# Patient Record
Sex: Male | Born: 1990 | Race: Black or African American | Hispanic: No | Marital: Single | Smoking: Never smoker
Health system: Southern US, Community
[De-identification: ages and names within clinical notes are randomized; demographics above are authoritative.]

## PROBLEM LIST (undated history)

## (undated) DIAGNOSIS — Z789 Other specified health status: Secondary | ICD-10-CM

---
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
## 2012-12-19 ENCOUNTER — Encounter (HOSPITAL_COMMUNITY): Payer: Self-pay | Admitting: Emergency Medicine

## 2012-12-19 ENCOUNTER — Emergency Department (HOSPITAL_COMMUNITY)
Admission: EM | Admit: 2012-12-19 | Discharge: 2012-12-19 | Disposition: A | Discharge status: Home / Self Care | Payer: BC Managed Care – PPO | Attending: Emergency Medicine | Admitting: Emergency Medicine

## 2012-12-19 ENCOUNTER — Emergency Department (HOSPITAL_COMMUNITY): Payer: BC Managed Care – PPO

## 2012-12-19 DIAGNOSIS — M25569 Pain in unspecified knee: Secondary | ICD-10-CM | POA: Insufficient documentation

## 2012-12-19 DIAGNOSIS — M25562 Pain in left knee: Secondary | ICD-10-CM

## 2012-12-19 DIAGNOSIS — R52 Pain, unspecified: Secondary | ICD-10-CM | POA: Insufficient documentation

## 2012-12-19 MED ORDER — IBUPROFEN 800 MG PO TABS
800.0000 mg | ORAL_TABLET | Freq: Once | ORAL | Status: AC
  Administered 2012-12-19: 800 mg via ORAL
  Filled 2012-12-19: qty 1

## 2012-12-19 MED ORDER — IBUPROFEN 600 MG PO TABS: 600.0000 mg | ORAL_TABLET | Freq: Four times a day (QID) | ORAL | Status: AC | PRN

## 2012-12-19 NOTE — ED Provider Notes (Signed)
CSN: [id number]     Arrival date & time 12/19/12  1120 History   First MD Initiated Contact with Patient 12/19/12 1129     Chief Complaint  Patient presents with  . Knee Injury   (Consider location/radiation/quality/duration/timing/severity/associated sxs/prior Treatment) HPI Comments: Patient is a 22 year old male with a history of bilateral knee surgery during childhood secondary to bowing of legs who presents today for L knee pain. Patient states he felt and "electric" type pain in his left knee one week ago which was fleeting and resolved spontaneously. He endorses the pain recurring 2 days ago which has persisted as an aching, sharp, throbbing, stabbing pain since this time. Pain is nonradiating and worse with palpation and ambulation/weightbearing. Patient has applied ice to the affected area with mild relief. He has not tried any over-the-counter medications. He denies any direct trauma or injury to his left knee inciting pain as well as associated fever, redness, pallor, numbness/tingling, and weakness.  The history is provided by the patient. No language interpreter was used.    History reviewed. No pertinent past medical history. History reviewed. No pertinent past surgical history. History reviewed. No pertinent family history. History  Substance Use Topics  . Smoking status: Never Smoker   . Smokeless tobacco: Never Used  . Alcohol Use: No    Review of Systems  Musculoskeletal: Positive for arthralgias and joint swelling.  All other systems reviewed and are negative.    Allergies  Review of patient's allergies indicates no known allergies.  Home Medications   Current Outpatient Rx  Name  Route  Sig  Dispense  Refill  . ibuprofen (ADVIL,MOTRIN) 600 MG tablet   Oral   Take 1 tablet (600 mg total) by mouth every 6 (six) hours as needed.   30 tablet   0    BP 123/59  Pulse 73  Temp(Src) 99.1 F (37.3 C) (Oral)  Resp 18  SpO2 100%  Physical Exam  Nursing  note and vitals reviewed. Constitutional: He is oriented to person, place, and time. He appears well-developed and well-nourished. No distress.  HENT:  Head: Normocephalic and atraumatic.  Eyes: Conjunctivae and EOM are normal. No scleral icterus.  Neck: Normal range of motion.  Cardiovascular: Normal rate, regular rhythm and intact distal pulses.   DP and PT pulses 2+ in LLE.   Pulmonary/Chest: Effort normal. No respiratory distress.  Musculoskeletal:       Left knee: He exhibits decreased range of motion (Secondary to pain) and swelling (Mild). He exhibits no effusion, no ecchymosis, no deformity, no erythema, normal alignment, no LCL laxity and no MCL laxity. Tenderness found. Lateral joint line tenderness noted.       Left upper leg: Normal.       Left lower leg: Normal.       Legs: Tenderness to palpation inferior to the patella as well as to lateral joint line. Mild swelling appreciated without erythema, heat to touch, or red linear streaking. Decreased range of motion with flexion noted secondary to pain. No laxity appreciated.  Neurological: He is alert and oriented to person, place, and time.  No gross sensory deficits appreciated. Achilles and patellar reflexes and left lower extremity 2+. Patient ambulatory with antalgic gait.  Skin: Skin is warm and dry. No rash noted. He is not diaphoretic. No erythema. No pallor.  Psychiatric: He has a normal mood and affect. His behavior is normal.    ED Course  Procedures (including critical care time) Labs Review Labs Reviewed - No  data to display  Imaging Review Dg Knee Complete 4 Views Left  12/19/2012   CLINICAL DATA:  Twisting injury left knee, diffuse pain.  EXAM: LEFT KNEE - COMPLETE 4+ VIEW  COMPARISON:  None.  FINDINGS: There is a joint effusion. Slight flattening and indentation along the medial femoral condyle. Mild peaking of the tibial spines. Tripartite patella. Minimal patellofemoral osteophytosis.  IMPRESSION: 1. Small  joint effusion. 2. Slight flattening and indentation along the medial femoral condyle. An osteochondral defect cannot be excluded.   Electronically Signed   By: Leanna Battles M.D.   On: 12/19/2012 12:12    EKG Interpretation   None       MDM   1. Knee pain, acute, left    Uncomplicated left knee pain. Patient well and nontoxic appearing, hemodynamically stable, and afebrile. He is neurovascularly intact on physical exam as well as weight bearing and ambulatory with antalgic gait. No evidence of septic joint appreciated. 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 Xray significant for small joint effusion. No fracture or dislocation identified. Patient given knee sleeve in ED as well as crutches for WBAT. He will be given orthopedic referral for f/u. RICE and ibuprofen advised for symptoms. Return precautions discussed and patient agreeable to plan with no unaddressed concerns.   Filed Vitals:   12/19/12 1134  BP: 123/59  Pulse: 73  Temp: 99.1 F (37.3 C)  TempSrc: Oral  Resp: 18  SpO2: 100%       Antony Madura, PA-C 12/19/12 1237

## 2012-12-19 NOTE — ED Notes (Signed)
Transported to xray 

## 2012-12-19 NOTE — Progress Notes (Signed)
[facility] provided pt with a list of primary care resources, [facility] [employer] Card application, and [facility] information.  °

## 2012-12-19 NOTE — ED Provider Notes (Signed)
Medical screening examination/treatment/procedure(s) were performed by non-physician practitioner and as supervising physician I was immediately available for consultation/collaboration.  EKG Interpretation   None       Laylani Pudwill, MD, FACEP   Kaelen Brennan L Troy Kanouse, MD 12/19/12 1600 

## 2012-12-19 NOTE — ED Notes (Signed)
Pt was at work one week ago and felt his knee tweak. Pt states it felt like electricity in his left knee. Then it happened again 2 days ago. Pt states it feels like it is locked up like he is unable to bed his knee. Pt states knee is swollen and was purple . Pt iced knee. Pain 10/10. Pt states he had knee surgery as an infant on that knee and may have something to do with his ligaments.

## 2014-09-15 ENCOUNTER — Ambulatory Visit
Admission: EM | Admit: 2014-09-15 | Discharge: 2014-09-15 | Disposition: A | Discharge status: Hospice | Payer: Self-pay | Attending: Family Medicine | Admitting: Family Medicine

## 2014-09-15 ENCOUNTER — Emergency Department (HOSPITAL_COMMUNITY)
Admission: EM | Admit: 2014-09-15 | Discharge: 2014-09-15 | Disposition: A | Discharge status: Home / Self Care | Payer: Self-pay | Attending: Emergency Medicine | Admitting: Emergency Medicine

## 2014-09-15 ENCOUNTER — Encounter (HOSPITAL_COMMUNITY): Payer: Self-pay | Admitting: Emergency Medicine

## 2014-09-15 ENCOUNTER — Emergency Department (HOSPITAL_COMMUNITY): Payer: Self-pay

## 2014-09-15 DIAGNOSIS — R748 Abnormal levels of other serum enzymes: Secondary | ICD-10-CM

## 2014-09-15 DIAGNOSIS — R109 Unspecified abdominal pain: Secondary | ICD-10-CM

## 2014-09-15 DIAGNOSIS — N201 Calculus of ureter: Secondary | ICD-10-CM | POA: Insufficient documentation

## 2014-09-15 DIAGNOSIS — R1011 Right upper quadrant pain: Secondary | ICD-10-CM

## 2014-09-15 DIAGNOSIS — R319 Hematuria, unspecified: Secondary | ICD-10-CM

## 2014-09-15 HISTORY — DX: Other specified health status: Z78.9

## 2014-09-15 LAB — CBC WITH DIFFERENTIAL/PLATELET
BASOS ABS: 0 10*3/uL (ref 0–0.1)
Eosinophils Absolute: 0 10*3/uL (ref 0–0.7)
HCT: 39.2 % — ABNORMAL LOW (ref 40.0–52.0)
Hemoglobin: 12.7 g/dL — ABNORMAL LOW (ref 13.0–18.0)
Lymphs Abs: 0.9 10*3/uL — ABNORMAL LOW (ref 1.0–3.6)
MCH: 27.4 pg (ref 26.0–34.0)
MCHC: 32.4 g/dL (ref 32.0–36.0)
MCV: 84.4 fL (ref 80.0–100.0)
Monocytes Absolute: 0.6 10*3/uL (ref 0.2–1.0)
Monocytes Relative: 7 %
Neutro Abs: 7.3 10*3/uL — ABNORMAL HIGH (ref 1.4–6.5)
Neutrophils Relative %: 83 %
Platelets: 106 10*3/uL — ABNORMAL LOW (ref 150–440)
RBC: 4.65 MIL/uL (ref 4.40–5.90)
RDW: 13.9 % (ref 11.5–14.5)
WBC: 8.8 10*3/uL (ref 3.8–10.6)

## 2014-09-15 LAB — URINALYSIS COMPLETE WITH MICROSCOPIC (ARMC ONLY)
BACTERIA UA: NONE SEEN — AB
Bilirubin Urine: NEGATIVE
Glucose, UA: NEGATIVE mg/dL
Ketones, ur: NEGATIVE mg/dL
Leukocytes, UA: NEGATIVE
NITRITE: NEGATIVE
PH: 6 (ref 5.0–8.0)
SPECIFIC GRAVITY, URINE: 1.025 (ref 1.005–1.030)
Squamous Epithelial / LPF: NONE SEEN — AB

## 2014-09-15 LAB — COMPREHENSIVE METABOLIC PANEL
ALBUMIN: 4.2 g/dL (ref 3.5–5.0)
ALT: 57 U/L (ref 17–63)
ANION GAP: 8 (ref 5–15)
AST: 250 U/L — ABNORMAL HIGH (ref 15–41)
Alkaline Phosphatase: 54 U/L (ref 38–126)
BUN: 17 mg/dL (ref 6–20)
CHLORIDE: 106 mmol/L (ref 101–111)
CO2: 26 mmol/L (ref 22–32)
Calcium: 9.1 mg/dL (ref 8.9–10.3)
Creatinine, Ser: 0.92 mg/dL (ref 0.61–1.24)
GFR calc non Af Amer: >60 mL/min (ref >60)
Glucose, Bld: 156 mg/dL — ABNORMAL HIGH (ref 65–99)
POTASSIUM: 3.5 mmol/L (ref 3.5–5.1)
SODIUM: 140 mmol/L (ref 135–145)
Total Bilirubin: 0.3 mg/dL (ref 0.3–1.2)
Total Protein: 7.1 g/dL (ref 6.5–8.1)

## 2014-09-15 LAB — AMYLASE: Amylase: 93 U/L (ref 28–100)

## 2014-09-15 LAB — LIPASE, BLOOD: LIPASE: 18 U/L — AB (ref 22–51)

## 2014-09-15 MED ORDER — ONDANSETRON 8 MG PO TBDP: 8.0000 mg | ORAL_TABLET | Freq: Three times a day (TID) | ORAL | Status: DC | PRN

## 2014-09-15 MED ORDER — PROMETHAZINE HCL 25 MG PO TABS: 25.0000 mg | ORAL_TABLET | Freq: Four times a day (QID) | ORAL | Status: AC | PRN

## 2014-09-15 MED ORDER — ONDANSETRON 8 MG PO TBDP
8.0000 mg | ORAL_TABLET | Freq: Once | ORAL | Status: AC
  Administered 2014-09-15: 8 mg via ORAL

## 2014-09-15 MED ORDER — KETOROLAC TROMETHAMINE 60 MG/2ML IM SOLN
60.0000 mg | Freq: Once | INTRAMUSCULAR | Status: AC
  Administered 2014-09-15: 60 mg via INTRAMUSCULAR

## 2014-09-15 MED ORDER — TAMSULOSIN HCL 0.4 MG PO CAPS: 0.4000 mg | ORAL_CAPSULE | Freq: Every day | ORAL | Status: AC

## 2014-09-15 MED ORDER — HYDROCODONE-ACETAMINOPHEN 5-325 MG PO TABS: 1.0000 | ORAL_TABLET | Freq: Three times a day (TID) | ORAL | Status: AC | PRN

## 2014-09-15 NOTE — ED Notes (Signed)
Patient left at this time with all belongings. Given instructions to follow up with urology and take stone specimen to be biopsied

## 2014-09-15 NOTE — ED Provider Notes (Signed)
CSN: [id number]     Arrival date & time 09/15/14  1824 History   First MD Initiated Contact with Patient 09/15/14 1933     Chief Complaint  Patient presents with  . Abdominal Pain     (Consider location/radiation/quality/duration/timing/severity/associated sxs/prior Treatment) The history is provided by the patient.     Pt p/w sharp right flank pain that began around 11:30am today.  Pain is 10/10 intensity.  Associated N/V.  No exacerbating factors.  Pt was seen at [facility] and dx with right flank pain, hematuria, elevated LFTs.  Suspected kidney stone, also found to have elevated AST (250), Hematuria on UA without infection.  Sent to ED for CT scan.  Pt reports he had improvement with toradol and zofran, then pain returned and he took prescribed vicodin with improvement.  Currently pain is well controlled.  Denies fevers, dysuria, urinary frequency or urgency, hematuria, bowel changes, testicular pain or swelling, CP, SOB, cough.   Past Medical History  Diagnosis Date  . Patient denies medical problems    Past Surgical History  Procedure Laterality Date  . Knee surgery  2009   No family history on file. Social History  Substance Use Topics  . Smoking status: Never Smoker   . Smokeless tobacco: Never Used  . Alcohol Use: No    Review of Systems  All other systems reviewed and are negative.     Allergies  Review of patient's allergies indicates no known allergies.  Home Medications   Prior to Admission medications   Medication Sig Start Date End Date Taking? Authorizing Provider  HYDROcodone-acetaminophen (NORCO) 5-325 MG per tablet Take 1 tablet by mouth every 8 (eight) hours as needed for moderate pain. 09/15/14   Hassan Rowan, MD  ibuprofen (ADVIL,MOTRIN) 600 MG tablet Take 1 tablet (600 mg total) by mouth every 6 (six) hours as needed. 12/19/12   Antony Madura, PA-C  ondansetron (ZOFRAN ODT) 8 MG disintegrating tablet Take 1 tablet (8 mg total) by mouth  every 8 (eight) hours as needed for nausea or vomiting. 09/15/14   Hassan Rowan, MD   BP 132/83 mmHg  Pulse 67  Temp(Src) 99.1 F (37.3 C) (Oral)  Resp 18  SpO2 100% Physical Exam  Constitutional: He appears well-developed and well-nourished. No distress.  HENT:  Head: Normocephalic and atraumatic.  Neck: Neck supple.  Cardiovascular: Normal rate and regular rhythm.   Pulmonary/Chest: Effort normal and breath sounds normal. No respiratory distress. He has no wheezes. He has no rales.  Abdominal: Soft. He exhibits no distension and no mass. There is no tenderness. There is CVA tenderness (right). There is no rebound and no guarding.  Neurological: He is alert. He exhibits normal muscle tone.  Skin: He is not diaphoretic.  Nursing note and vitals reviewed.   ED Course  Procedures (including critical care time) Labs Review Labs Reviewed - No data to display  Imaging Review Ct Abdomen Pelvis Wo Contrast  09/15/2014   CLINICAL DATA:  24 year old male with right flank pain, CVA tenderness, microhematuria.  EXAM: CT ABDOMEN AND PELVIS WITHOUT CONTRAST  TECHNIQUE: Multidetector CT imaging of the abdomen and pelvis was performed following the standard protocol without IV contrast.  COMPARISON:  None.  FINDINGS: Lung bases are clear.  Punctate sized stones in both kidneys. Mild hydronephrosis and hydroureter on the right with a 3 mm stone demonstrated at the right ureterovesical junction. Minimal stranding around the right kidney. No hydronephrosis or hydroureter on the left. No bladder wall thickening.  No bladder stones.  Unenhanced appearance of the liver, spleen, pancreas, adrenal glands, abdominal aorta, inferior vena cava, and retroperitoneal lymph nodes is unremarkable. Stomach, small bowel, and colon are not abnormally distended. No free air or free fluid in the abdomen. Abdominal wall musculature appears intact.  Pelvis: Appendix is normal. No free or loculated pelvic fluid collections.  Prostate gland is not enlarged. No pelvic mass or lymphadenopathy. No destructive bone lesions.  IMPRESSION: 3 mm stone in the distal right ureter with mild proximal obstruction. Multiple additional punctate size nonobstructing stones in both kidneys.   Electronically Signed   By: Burman Nieves M.D.   On: 09/15/2014 21:36   I have personally reviewed and evaluated these images and lab results as part of my medical decision-making.   EKG Interpretation None       10:01 PM Pt remains comfortable, is hungry.    MDM   Final diagnoses:  Right ureteral stone    Afebrile, nontoxic patient with right flank pain that began acutely this morning. Associated N/V.  Seen at [facility] urgent care with hematuria no infection on UA.  Labs significant for AST elevation.  No RUQ tenderness.  Pt feeling improvement with toradol and home meds. It does not appear the original facility performed a urine culture so I did order this prior to his discharge.  CT shows 3mm stone and distal right ureter.  D/C home with phenergan (ODT zofran previously prescribed was too expensive for him), tamsulosin, urology follow up.  Discussed result, findings, treatment, and follow up  with patient.  Pt given return precautions.  Pt verbalizes understanding and agrees with plan.       Please see labs performed earlier this afternoon at outside [facility] facility.     Trixie Dredge, PA-C 09/15/14 2252  Tilden Fossa, MD 09/16/14 [phone]

## 2014-09-15 NOTE — ED Notes (Signed)
Pt reports that he was seen at urgent care and was sent here to rule out kidney stones. Pt reports sudden onset of right flank pain radiating to his RLQ. Pt alert x4. NAD at this time.

## 2014-09-15 NOTE — ED Notes (Signed)
Right sided sharp pain. 9/10. N/V. X 1 hour, sudden onset.

## 2014-09-15 NOTE — ED Provider Notes (Signed)
CSN: [id number]     Arrival date & time 09/15/14  1307 History   First MD Initiated Contact with Patient 09/15/14 1410     Chief Complaint  Patient presents with  . Abdominal Pain   (Consider location/radiation/quality/duration/timing/severity/associated sxs/prior Treatment) Patient is a 24 y.o. male presenting with abdominal pain. The history is provided by the patient and a significant other. No language interpreter was used.  Abdominal Pain Pain location:  R flank and RLQ Pain quality: sharp and shooting   Pain radiates to:  RLQ Pain severity:  Severe Onset quality:  Sudden Duration:  3 hours Timing:  Constant Progression:  Waxing and waning Chronicity:  New Context: retching   Context: not alcohol use, not awakening from sleep, not diet changes, not medication withdrawal, not previous surgeries, not sick contacts and not suspicious food intake   Relieved by:  Nothing Ineffective treatments:  None tried Associated symptoms: vomiting   Associated symptoms: no anorexia, no chest pain, no constipation, no cough, no dysuria and no shortness of breath   Risk factors: not elderly, has not had multiple surgeries and not obese     Past Medical History  Diagnosis Date  . Patient denies medical problems    Past Surgical History  Procedure Laterality Date  . Knee surgery  2009   No family history on file. Social History  Substance Use Topics  . Smoking status: Never Smoker   . Smokeless tobacco: Never Used  . Alcohol Use: No    Review of Systems  Respiratory: Negative for cough and shortness of breath.   Cardiovascular: Negative for chest pain.  Gastrointestinal: Positive for vomiting and abdominal pain. Negative for constipation and anorexia.  Genitourinary: Negative for dysuria.    Allergies  Review of patient's allergies indicates no known allergies.  Home Medications   Prior to Admission medications   Medication Sig Start Date End Date Taking? Authorizing Provider   HYDROcodone-acetaminophen (NORCO) 5-325 MG per tablet Take 1 tablet by mouth every 8 (eight) hours as needed for moderate pain. 09/15/14   Hassan Rowan, MD  ibuprofen (ADVIL,MOTRIN) 600 MG tablet Take 1 tablet (600 mg total) by mouth every 6 (six) hours as needed. 12/19/12   Antony Madura, PA-C  ondansetron (ZOFRAN ODT) 8 MG disintegrating tablet Take 1 tablet (8 mg total) by mouth every 8 (eight) hours as needed for nausea or vomiting. 09/15/14   Hassan Rowan, MD   Meds Ordered and Administered this Visit   Medications  ondansetron (ZOFRAN-ODT) disintegrating tablet 8 mg (8 mg Oral Given 09/15/14 1428)  ketorolac (TORADOL) injection 60 mg (60 mg Intramuscular Given 09/15/14 1428)    BP 127/77 mmHg  Pulse 59  Temp(Src) 97.2 F (36.2 C) (Tympanic)  Resp 16  Ht 5' 5.5" (1.664 m)  Wt 118 lb (53.524 kg)  BMI 19.33 kg/m2  SpO2 97% No data found.   Physical Exam  Constitutional: He is oriented to person, place, and time. He appears well-developed and well-nourished. He appears distressed.  HENT:  Head: Normocephalic and atraumatic.  Eyes: Conjunctivae are normal. Pupils are equal, round, and reactive to light.  Neck: Normal range of motion. Neck supple.  Cardiovascular: Normal rate and regular rhythm.   Pulmonary/Chest: Effort normal and breath sounds normal.  Abdominal: Soft. Bowel sounds are normal. He exhibits no distension. There is no tenderness.  Musculoskeletal: Normal range of motion. He exhibits no edema.  Neurological: He is alert and oriented to person, place, and time.  Skin: Skin is warm and  dry. No erythema.  Psychiatric: He has a normal mood and affect.    ED Course  Procedures (including critical care time)  Labs Review Labs Reviewed  URINALYSIS COMPLETEWITH MICROSCOPIC ([facility] ONLY) - Abnormal; Notable for the following:    Hgb urine dipstick 3+ (*)    Protein, ur TRACE (*)    Bacteria, UA NONE SEEN (*)    Squamous Epithelial / LPF NONE SEEN (*)    All other  components within normal limits  CBC WITH DIFFERENTIAL/PLATELET - Abnormal; Notable for the following:    Hemoglobin 12.7 (*)    HCT 39.2 (*)    Platelets 106 (*)    Neutro Abs 7.3 (*)    Lymphs Abs 0.9 (*)    All other components within normal limits  LIPASE, BLOOD - Abnormal; Notable for the following:    Lipase 18 (*)    All other components within normal limits  COMPREHENSIVE METABOLIC PANEL - Abnormal; Notable for the following:    Glucose, Bld 156 (*)    AST 250 (*)    All other components within normal limits  AMYLASE    Imaging Review No results found.   Visual Acuity Review  Right Eye Distance:   Left Eye Distance:   Bilateral Distance:    Right Eye Near:   Left Eye Near:    Bilateral Near:         MDM   1. Acute abdominal pain in right flank   2. Hematuria   3. Elevated liver enzymes     Explained to patient and his significant other that he has abnormal liver enzyme. That needs be followed up in 2 months with PCP of his choice.  The abdominal pain is probably from a stone is a need to strain his urine and it does recover the stone in 3-4 days he needs to be reevaluated this issue. Discussed with him about doing a CT scan of his abdomen because of the severity of the pain and the sudden onset of pain patient was fractured that there is a kidney stone this causing the pain and will refer him to facilitate for CT of the abdomen stat and ancillary orders. Normally we was sent to [facility] but he requests [facility] since he lives near the facility in [place].  The Toradol injection and Zofran sublingual helped pain and nausea considerably. Was sent home on Vicodin urine strainer as well    Hassan Rowan, MD 09/15/14 [phone]

## 2014-09-15 NOTE — Discharge Instructions (Signed)
Read the information below.  Use the prescribed medication as directed.  Please discuss all new medications with your pharmacist.  You may return to the Emergency Department at any time for worsening condition or any new symptoms that concern you.  If you develop high fevers, worsening flank/abdominal pain, uncontrolled vomiting, or are unable to tolerate fluids by mouth, return to the ER for a recheck.      Kidney Stones Kidney stones (urolithiasis) are deposits that form inside your kidneys. The intense pain is caused by the stone moving through the urinary tract. When the stone moves, the ureter goes into spasm around the stone. The stone is usually passed in the urine.  CAUSES   A disorder that makes certain neck glands produce too much parathyroid hormone (primary hyperparathyroidism).  A buildup of uric acid crystals, similar to gout in your joints.  Narrowing (stricture) of the ureter.  A kidney obstruction present at birth (congenital obstruction).  Previous surgery on the kidney or ureters.  Numerous kidney infections. SYMPTOMS   Feeling sick to your stomach (nauseous).  Throwing up (vomiting).  Blood in the urine (hematuria).  Pain that usually spreads (radiates) to the groin.  Frequency or urgency of urination. DIAGNOSIS   Taking a history and physical exam.  Blood or urine tests.  CT scan.  Occasionally, an examination of the inside of the urinary bladder (cystoscopy) is performed. TREATMENT   Observation.  Increasing your fluid intake.  Extracorporeal shock wave lithotripsy--This is a noninvasive procedure that uses shock waves to break up kidney stones.  Surgery may be needed if you have severe pain or persistent obstruction. There are various surgical procedures. Most of the procedures are performed with the use of small instruments. Only small incisions are needed to accommodate these instruments, so recovery time is minimized. The size, location, and  chemical composition are all important variables that will determine the proper choice of action for you. Talk to your health care provider to better understand your situation so that you will minimize the risk of injury to yourself and your kidney.  HOME CARE INSTRUCTIONS   Drink enough water and fluids to keep your urine clear or pale yellow. This will help you to pass the stone or stone fragments.  Strain all urine through the provided strainer. Keep all particulate matter and stones for your health care provider to see. The stone causing the pain may be as small as a grain of salt. It is very important to use the strainer each and every time you pass your urine. The collection of your stone will allow your health care provider to analyze it and verify that a stone has actually passed. The stone analysis will often identify what you can do to reduce the incidence of recurrences.  Only take over-the-counter or prescription medicines for pain, discomfort, or fever as directed by your health care provider.  Make a follow-up appointment with your health care provider as directed.  Get follow-up X-rays if required. The absence of pain does not always mean that the stone has passed. It may have only stopped moving. If the urine remains completely obstructed, it can cause loss of kidney function or even complete destruction of the kidney. It is your responsibility to make sure X-rays and follow-ups are completed. Ultrasounds of the kidney can show blockages and the status of the kidney. Ultrasounds are not associated with any radiation and can be performed easily in a matter of minutes. SEEK MEDICAL CARE IF:  You  experience pain that is progressive and unresponsive to any pain medicine you have been prescribed. SEEK IMMEDIATE MEDICAL CARE IF:   Pain cannot be controlled with the prescribed medicine.  You have a fever or shaking chills.  The severity or intensity of pain increases over 18 hours and  is not relieved by pain medicine.  You develop a new onset of abdominal pain.  You feel faint or pass out.  You are unable to urinate. MAKE SURE YOU:   Understand these instructions.  Will watch your condition.  Will get help right away if you are not doing well or get worse. Document Released: 12/22/2004 Document Revised: 08/24/2012 Document Reviewed: 05/25/2012 [facility] Patient Information 2015 [place], [place]. This information is not intended to replace advice given to you by your health care provider. Make sure you discuss any questions you have with your health care provider.  Dietary Guidelines to Help Prevent Kidney Stones Your risk of kidney stones can be decreased by adjusting the foods you eat. The most important thing you can do is drink enough fluid. You should drink enough fluid to keep your urine clear or pale yellow. The following guidelines provide specific information for the type of kidney stone you have had. GUIDELINES ACCORDING TO TYPE OF KIDNEY STONE Calcium Oxalate Kidney Stones  Reduce the amount of salt you eat. Foods that have a lot of salt cause your body to release excess calcium into your urine. The excess calcium can combine with a substance called oxalate to form kidney stones.  Reduce the amount of animal protein you eat if the amount you eat is excessive. Animal protein causes your body to release excess calcium into your urine. Ask your dietitian how much protein from animal sources you should be eating.  Avoid foods that are high in oxalates. If you take vitamins, they should have less than 500 mg of vitamin C. Your body turns vitamin C into oxalates. You do not need to avoid fruits and vegetables high in vitamin C. Calcium Phosphate Kidney Stones  Reduce the amount of salt you eat to help prevent the release of excess calcium into your urine.  Reduce the amount of animal protein you eat if the amount you eat is excessive. Animal protein causes your  body to release excess calcium into your urine. Ask your dietitian how much protein from animal sources you should be eating.  Get enough calcium from food or take a calcium supplement (ask your dietitian for recommendations). Food sources of calcium that do not increase your risk of kidney stones include:  Broccoli.  Dairy products, such as cheese and yogurt.  Pudding. Uric Acid Kidney Stones  Do not have more than 6 oz of animal protein per day. FOOD SOURCES Animal Protein Sources  Meat (all types).  Poultry.  Eggs.  Fish, seafood. Foods High in [employer] seasonings.  Soy sauce.  Teriyaki sauce.  Cured and processed meats.  Salted crackers and snack foods.  Fast food.  Canned soups and most canned foods. Foods High in Oxalates  Grains:  Amaranth.  Barley.  Grits.  Wheat germ.  Bran.  Buckwheat flour.  All bran cereals.  Pretzels.  Whole wheat bread.  Vegetables:  Beans (wax).  Beets and beet greens.  Collard greens.  Eggplant.  [place]ole.  Leeks.  Okra.  Parsley.  Rutabagas.  Spinach.  Swiss chard.  Tomato paste.  Fried potatoes.  Sweet potatoes.  Fruits:  Red currants.  Figs.  Kiwi.  Rhubarb.  Meat and Other Protein Sources:  Beans (dried).  Soy burgers and other soybean products.  Miso.  Nuts (peanuts, almonds, pecans, cashews, hazelnuts).  Nut butters.  Sesame seeds and tahini (paste made of sesame seeds).  Poppy seeds.  Beverages:  Chocolate drink mixes.  Soy milk.  Instant iced tea.  Juices made from high-oxalate fruits or vegetables.  Other:  Carob.  Chocolate.  Fruitcake.  Marmalades. Document Released: 04/18/2010 Document Revised: 12/27/2012 Document Reviewed: 11/18/2012 [facility] Patient Information 2015 [place], [place]. This information is not intended to replace advice given to you by your health care provider. Make sure you discuss any questions you have with your health  care provider.  Ureteral Colic (Kidney Stones) Ureteral colic is the result of a condition when kidney stones form inside the kidney. Once kidney stones are formed they may move into the tube that connects the kidney with the bladder (ureter). If this occurs, this condition may cause pain (colic) in the ureter.  CAUSES  Pain is caused by stone movement in the ureter and the obstruction caused by the stone. SYMPTOMS  The pain comes and goes as the ureter contracts around the stone. The pain is usually intense, sharp, and stabbing in character. The location of the pain may move as the stone moves through the ureter. When the stone is near the kidney the pain is usually located in the back and radiates to the belly (abdomen). When the stone is ready to pass into the bladder the pain is often located in the lower abdomen on the side the stone is located. At this location, the symptoms may mimic those of a urinary tract infection with urinary frequency. Once the stone is located here it often passes into the bladder and the pain disappears completely. TREATMENT   Your caregiver will provide you with medicine for pain relief.  You may require specialized follow-up X-rays.  The absence of pain does not always mean that the stone has passed. It may have just stopped moving. If the urine remains completely obstructed, it can cause loss of kidney function or even complete destruction of the involved kidney. It is your responsibility and in your interest that X-rays and follow-ups as suggested by your caregiver are completed. Relief of pain without passage of the stone can be associated with severe damage to the kidney, including loss of kidney function on that side.  If your stone does not pass on its own, additional measures may be taken by your caregiver to ensure its removal. HOME CARE INSTRUCTIONS   Increase your fluid intake. Water is the preferred fluid since juices containing vitamin C may acidify  the urine making it less likely for certain stones (uric acid stones) to pass.  Strain all urine. A strainer will be provided. Keep all particulate matter or stones for your caregiver to inspect.  Take your pain medicine as directed.  Make a follow-up appointment with your caregiver as directed.  Remember that the goal is passage of your stone. The absence of pain does not mean the stone is gone. Follow your caregiver's instructions.  Only take over-the-counter or prescription medicines for pain, discomfort, or fever as directed by your caregiver. SEEK MEDICAL CARE IF:   Pain cannot be controlled with the prescribed medicine.  You have a fever.  Pain continues for longer than your caregiver advises it should.  There is a change in the pain, and you develop chest discomfort or constant abdominal pain.  You feel faint or pass out.  MAKE SURE YOU:   Understand these instructions.  Will watch your condition.  Will get help right away if you are not doing well or get worse. Document Released: 10/01/2004 Document Revised: 04/18/2012 Document Reviewed: 06/18/2010 [facility] Patient Information 2015 [place]. This information is not intended to replace advice given to you by your health care provider. Make sure you discuss any questions you have with your health care provider.

## 2014-09-15 NOTE — Discharge Instructions (Signed)
Abdominal Pain Many things can cause belly (abdominal) pain. Most times, the belly pain is not dangerous. Many cases of belly pain can be watched and treated at home. HOME CARE   Do not take medicines that help you go poop (laxatives) unless told to by your doctor.  Only take medicine as told by your doctor.  Eat or drink as told by your doctor. Your doctor will tell you if you should be on a special diet. GET HELP IF:  You do not know what is causing your belly pain.  You have belly pain while you are sick to your stomach (nauseous) or have runny poop (diarrhea).  You have pain while you pee or poop.  Your belly pain wakes you up at night.  You have belly pain that gets worse or better when you eat.  You have belly pain that gets worse when you eat fatty foods.  You have a fever. GET HELP RIGHT AWAY IF:   The pain does not go away within 2 hours.  You keep throwing up (vomiting).  The pain changes and is only in the right or left part of the belly.  You have bloody or tarry looking poop. MAKE SURE YOU:   Understand these instructions.  Will watch your condition.  Will get help right away if you are not doing well or get worse. Document Released: 06/10/2007 Document Revised: 12/27/2012 Document Reviewed: 08/31/2012 [facility] Patient Information 2015 [place], [place]. This information is not intended to replace advice given to you by your health care provider. Make sure you discuss any questions you have with your health care provider.  Flank Pain Flank pain is pain in your side. The flank is the area of your side between your upper belly (abdomen) and your back. Pain in this area can be caused by many different things. HOME CARE Home care and treatment will depend on the cause of your pain.  Rest as told by your doctor.  Drink enough fluids to keep your pee (urine) clear or pale yellow.  Only take medicine as told by your doctor.  Tell your doctor about any  changes in your pain.  Follow up with your doctor. GET HELP RIGHT AWAY IF:   Your pain does not get better with medicine.   You have new symptoms or your symptoms get worse.  Your pain gets worse.   You have belly (abdominal) pain.   You are short of breath.   You always feel sick to your stomach (nauseous).   You keep throwing up (vomiting).   You have puffiness (swelling) in your belly.   You feel light-headed or you pass out (faint).   You have blood in your pee.  You have a fever or lasting symptoms for more than 2-3 days.  You have a fever and your symptoms suddenly get worse. MAKE SURE YOU:   Understand these instructions.  Will watch your condition.  Will get help right away if you are not doing well or get worse. Document Released: 10/01/2007 Document Revised: 05/08/2013 Document Reviewed: 08/06/2011 [facility] Patient Information 2015 [place], [place]. This information is not intended to replace advice given to you by your health care provider. Make sure you discuss any questions you have with your health care provider. Urine Strainer This strainer is used to catch or filter out any stones found in your urine. Place the strainer under your urine stream. Save any stones or objects that you find in your urine. Place them in a  plastic or glass container to show your caregiver. The stones vary in size - some can be very small, so make sure you check the strainer carefully. Your caregiver may send the stone to the lab. When the results are back, your caregiver may recommend medicines or diet changes.  Document Released: 09/27/2003 Document Revised: 03/16/2011 Document Reviewed: 11/04/2007 [facility] Patient Information 2015 [place], [place]. This information is not intended to replace advice given to you by your health care provider. Make sure you discuss any questions you have with your health care provider.  Also remember you do have abnormal liver enzyme that will  need be followed up in 1-2 months.

## 2014-09-17 LAB — URINE CULTURE

## 2015-02-08 IMAGING — CR DG KNEE COMPLETE 4+V*L*
4 series · 4 of 4 positions shown · non-contrast
Comparison: None.

CLINICAL DATA: Twisting injury left knee, diffuse pain.

EXAM:
LEFT KNEE - COMPLETE 4+ VIEW

[t knee ap left]
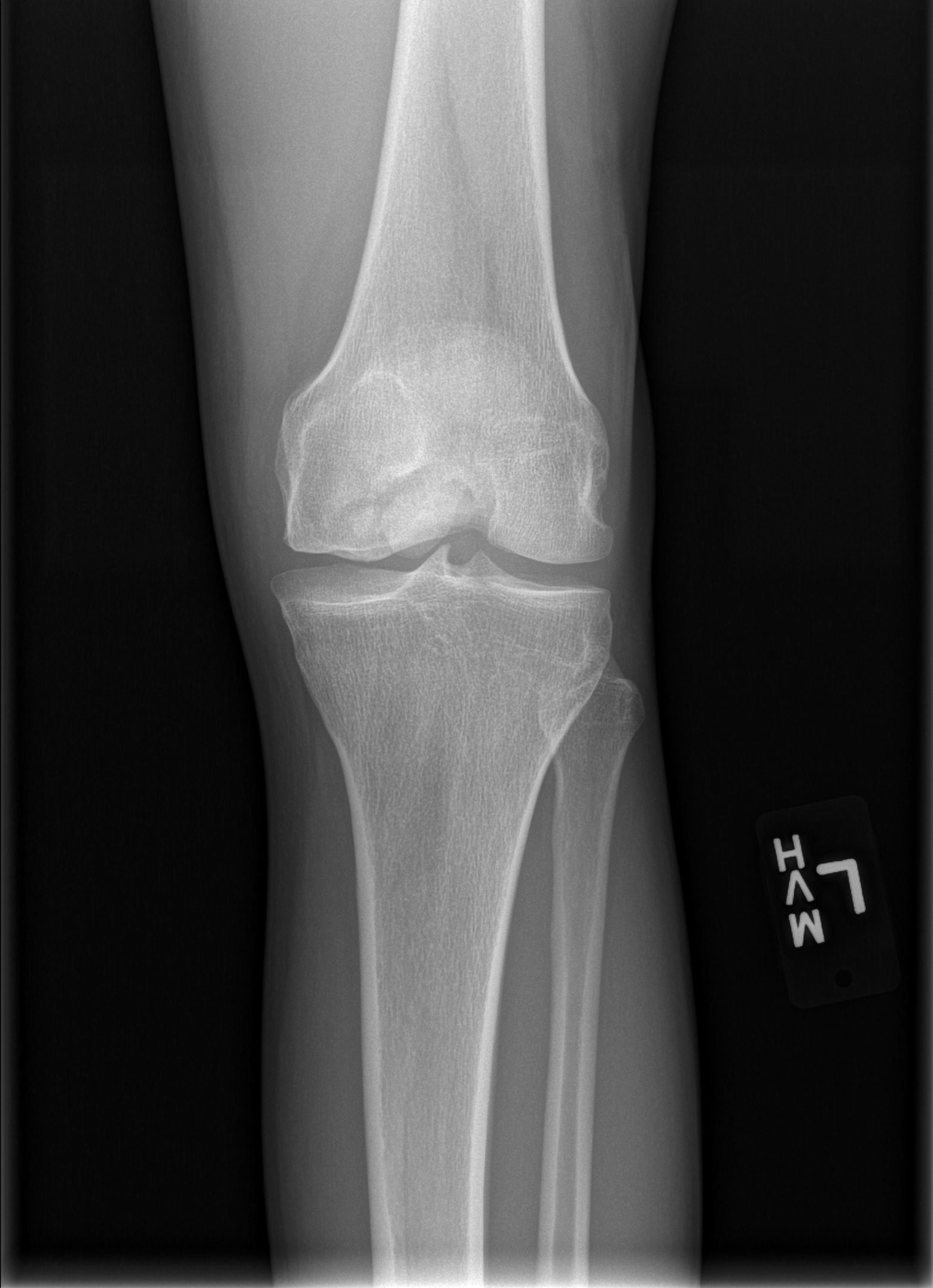

[t knee obl left (1 of 2)]
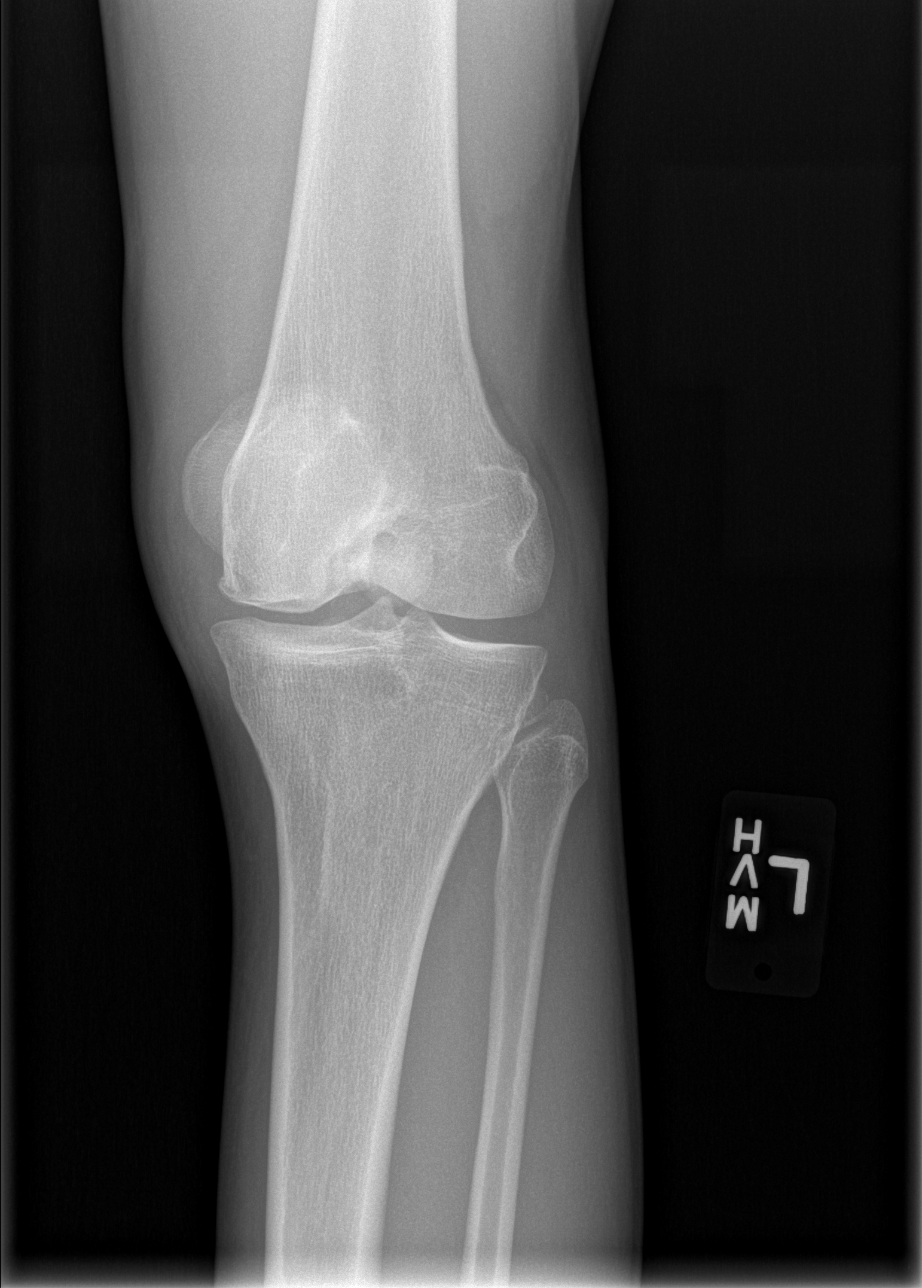

[t knee obl left (2 of 2)]
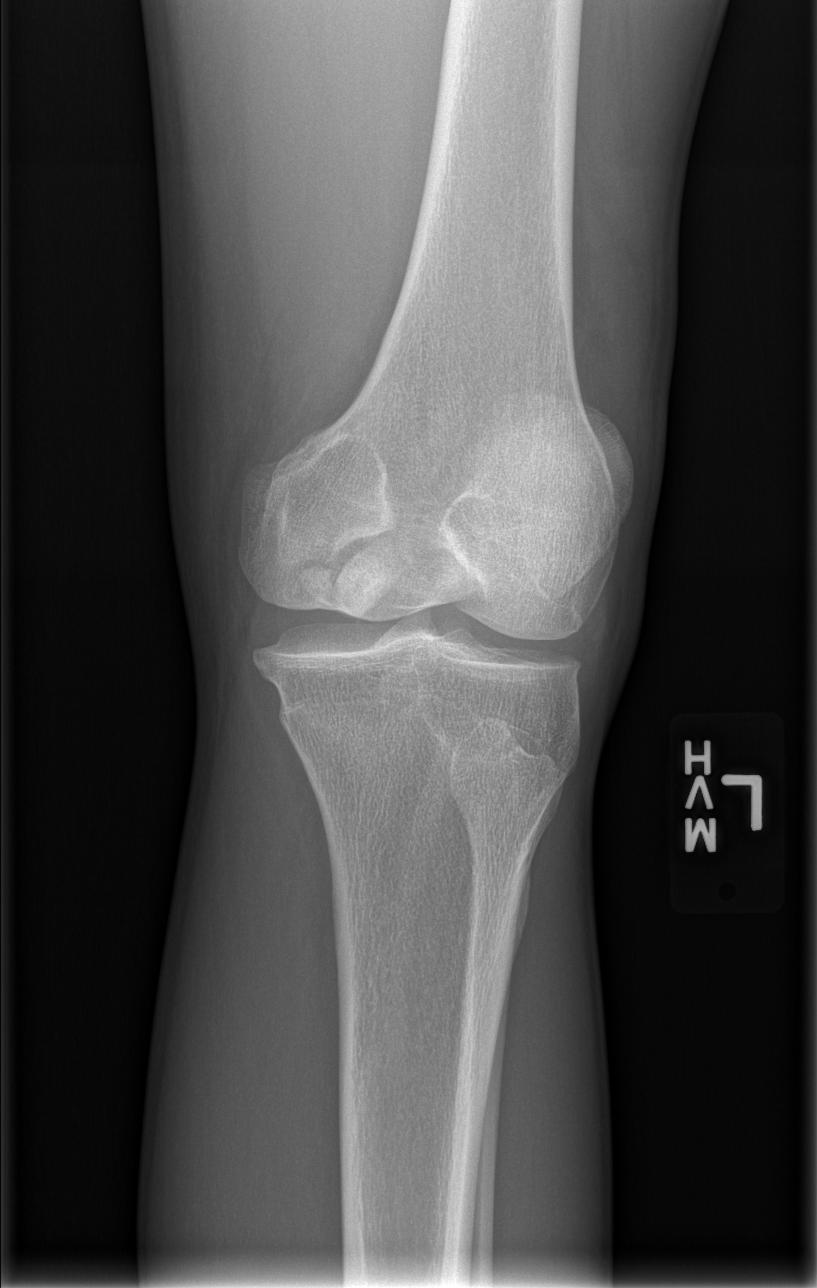

[t knee lat left]
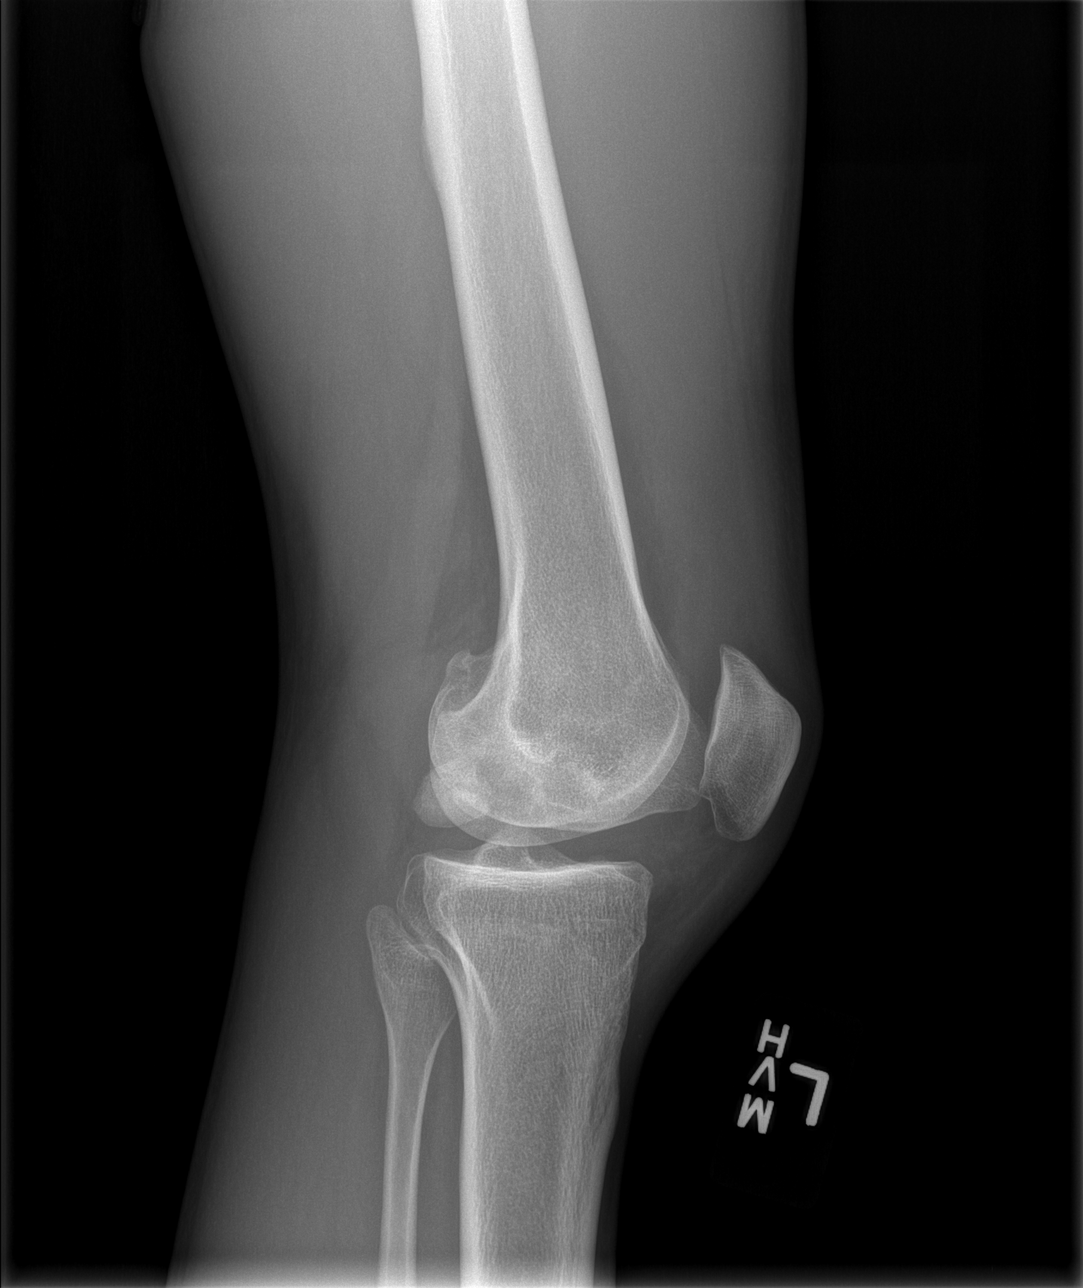

[4 of 4 positions shown; findings below may reference images not displayed]

FINDINGS: There is a joint effusion. Slight flattening and indentation along
the medial femoral condyle. Mild peaking of the tibial spines.
Tripartite patella. Minimal patellofemoral osteophytosis.
IMPRESSION: 1. Small joint effusion.
2. Slight flattening and indentation along the medial femoral
condyle. An osteochondral defect cannot be excluded.

## 2016-11-04 IMAGING — CT CT ABD-PELV W/O CM
2 of 4 series · 17 of 46 positions shown, 19 images · non-contrast
Comparison: None.

CLINICAL DATA: 24-year-old male with right flank pain, CVA
tenderness, microhematuria.

EXAM:
CT ABDOMEN AND PELVIS WITHOUT CONTRAST
TECHNIQUE: Multidetector CT imaging of the abdomen and pelvis was performed
following the standard protocol without IV contrast.

[Series 2: stone study 5.0 i30f 1 · axial · 0.65mm/px · z∈[+774,+1114]mm · 14 of 76 slices shown, 16 images]
[im 4/76  soft-tissue]
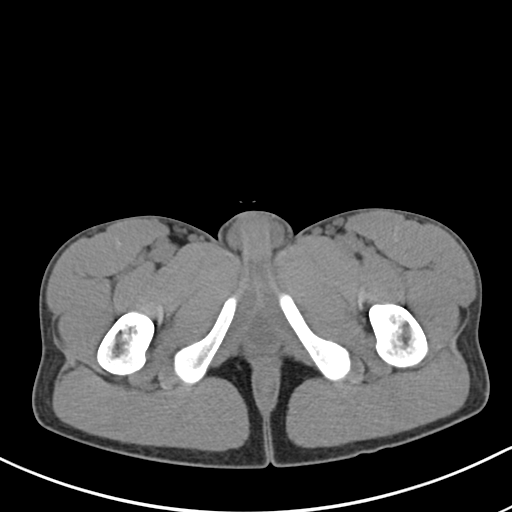
[im 4/76  bone]
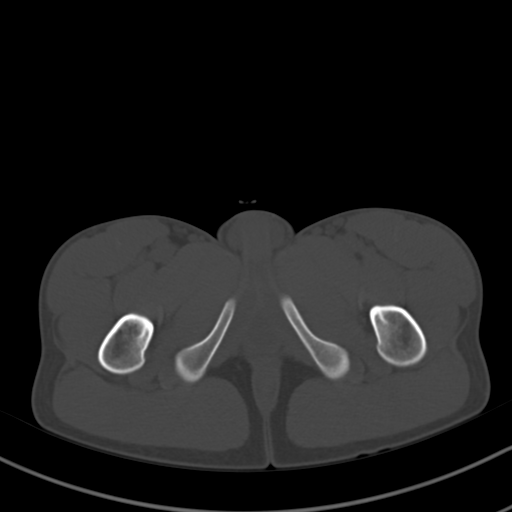
[im 10/76  soft-tissue]
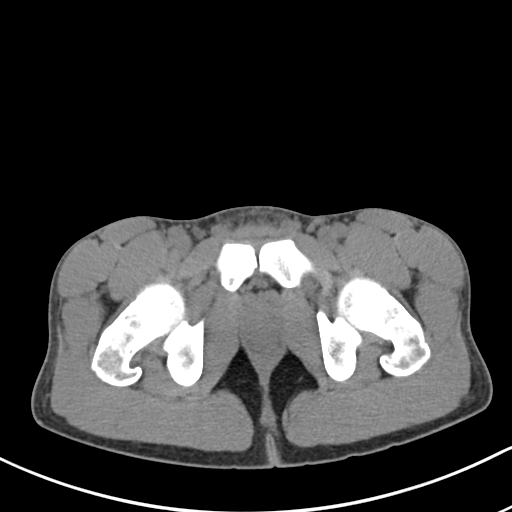
[im 16/76  soft-tissue]
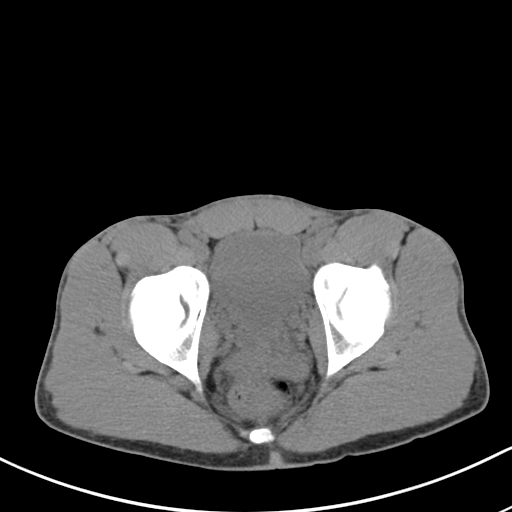
[im 19/76  soft-tissue]
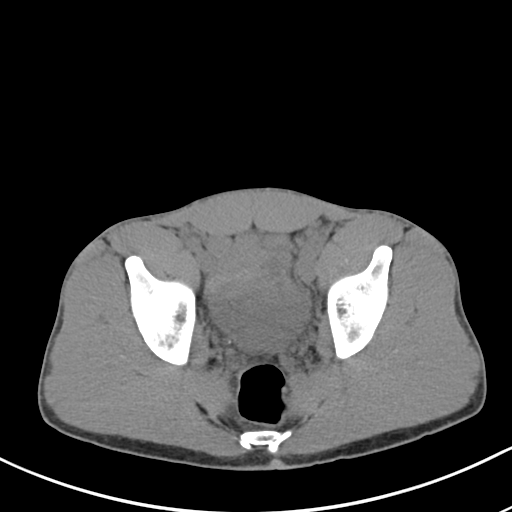
[im 26/76  soft-tissue]
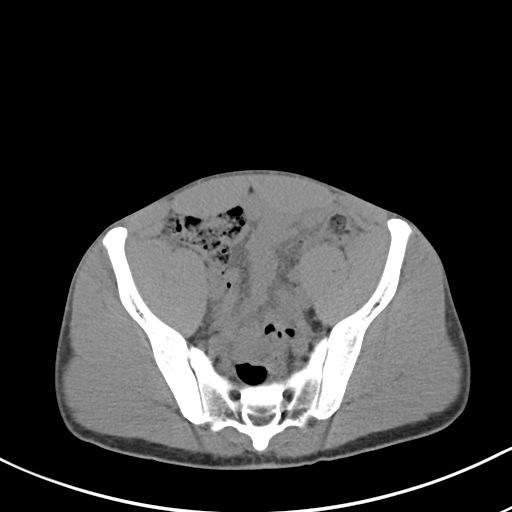
[im 32/76  soft-tissue]
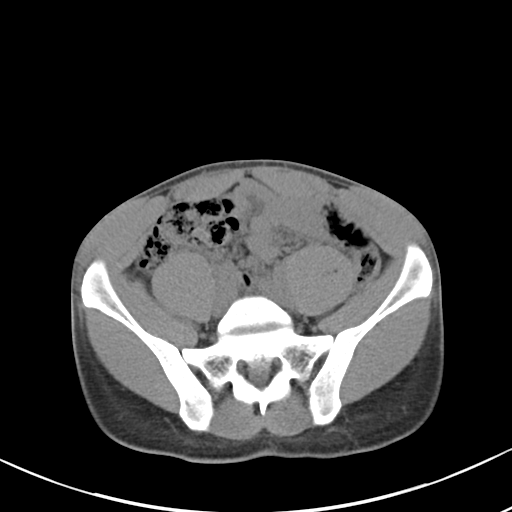
[im 35/76  soft-tissue]
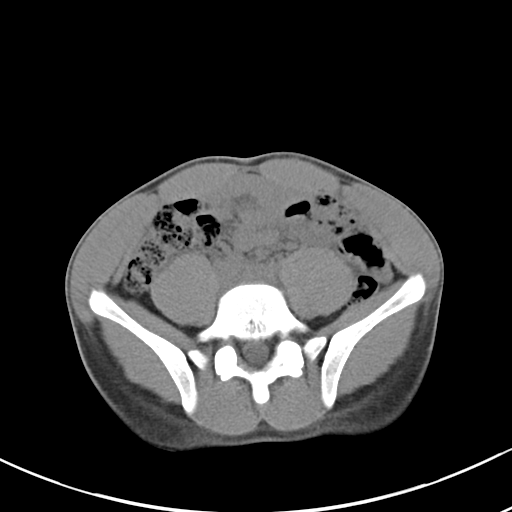
[im 41/76  soft-tissue]
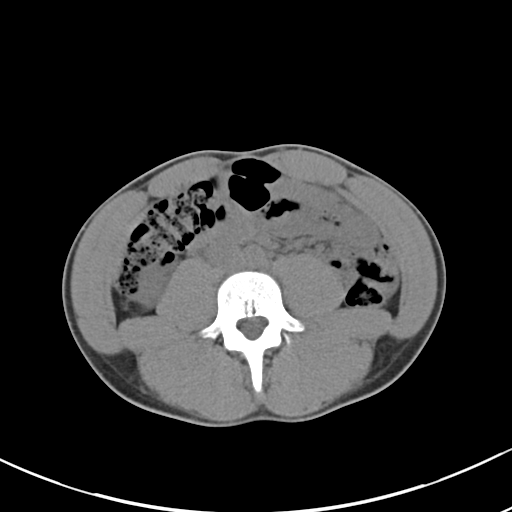
[im 44/76  soft-tissue]
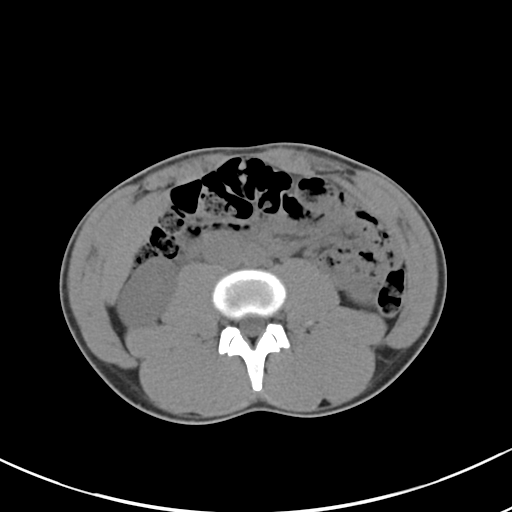
[im 44/76  bone]
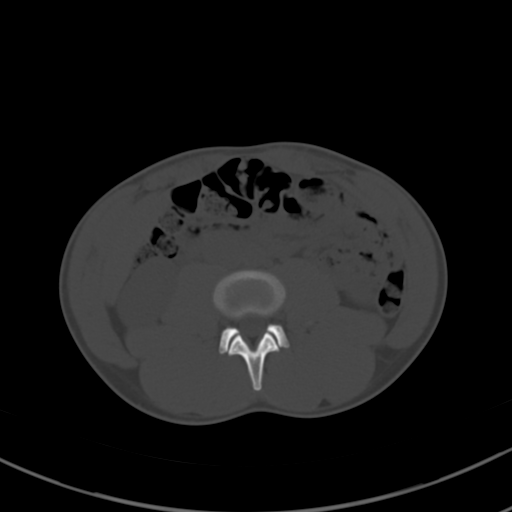
[im 51/76  soft-tissue]
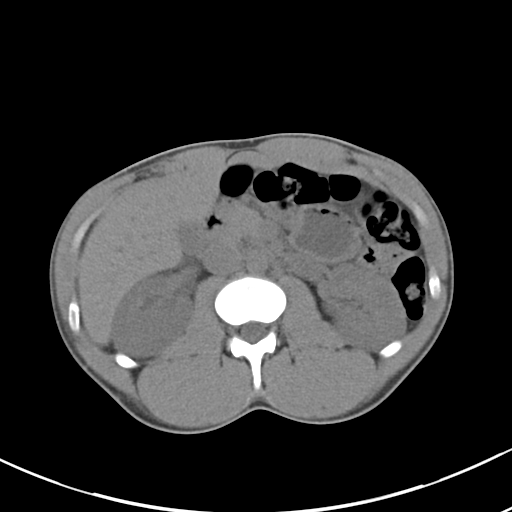
[im 57/76  soft-tissue]
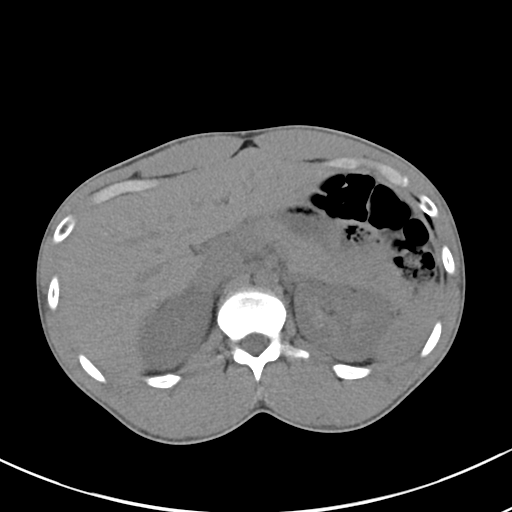
[im 60/76  soft-tissue]
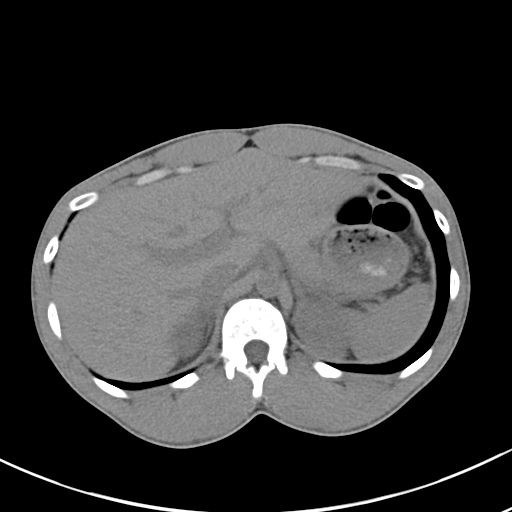
[im 66/76  soft-tissue]
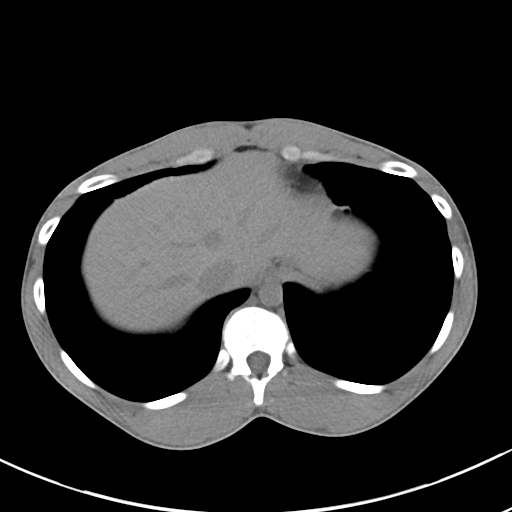
[im 72/76  soft-tissue]
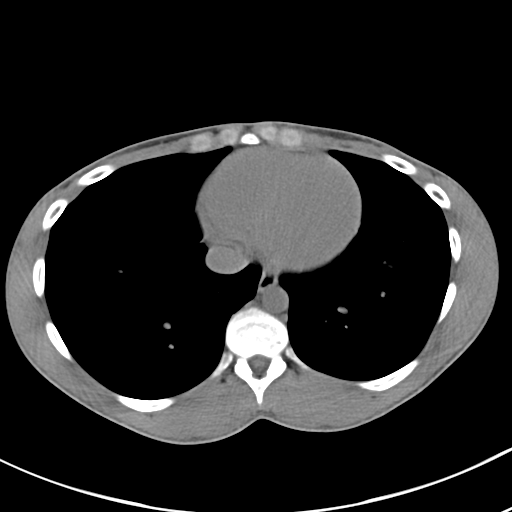

[Series 5: coronal soft tissue · coronal · 0.69mm/px · 3 of 79 slices shown]
[im 27/79  soft-tissue]
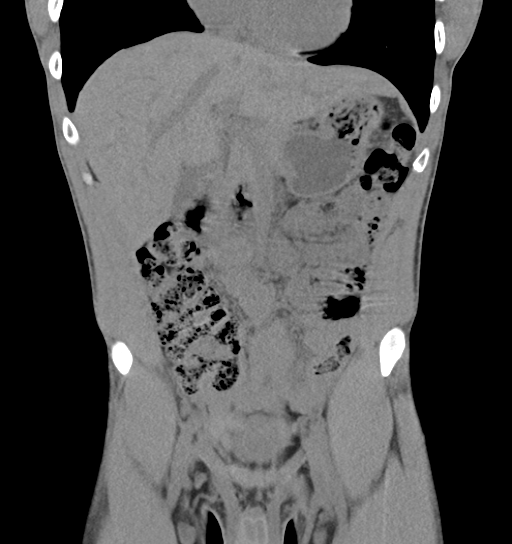
[im 35/79  soft-tissue]
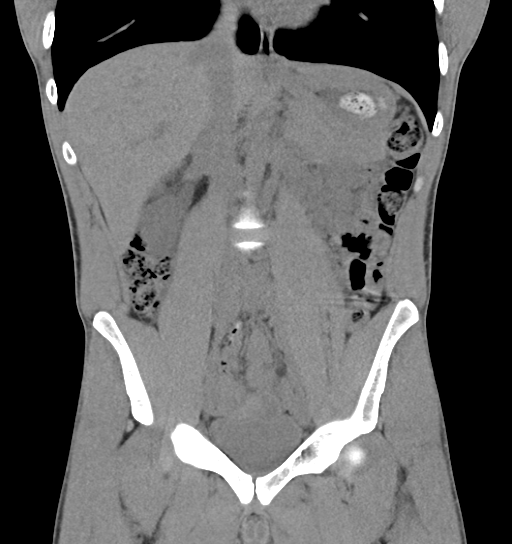
[im 44/79  soft-tissue]
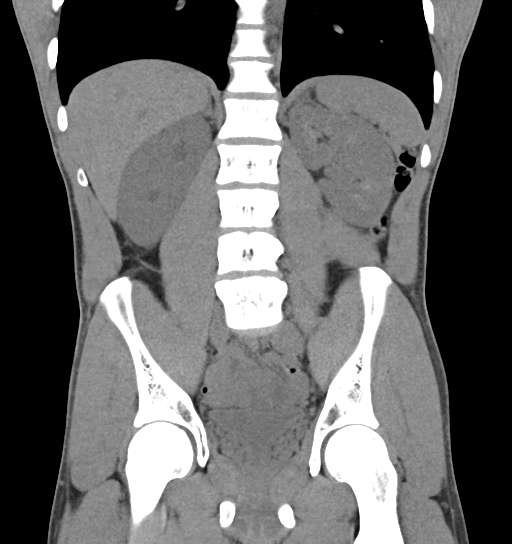

[17 of 46 positions shown; findings below may reference images not displayed]

FINDINGS: Lung bases are clear.

Punctate sized stones in both kidneys. Mild hydronephrosis and
hydroureter on the right with a 3 mm stone demonstrated at the right
ureterovesical junction. Minimal stranding around the right kidney.
No hydronephrosis or hydroureter on the left. No bladder wall
thickening. No bladder stones.

Unenhanced appearance of the liver, spleen, pancreas, adrenal
glands, abdominal aorta, inferior vena cava, and retroperitoneal
lymph nodes is unremarkable. Stomach, small bowel, and colon are not
abnormally distended. No free air or free fluid in the abdomen.
Abdominal wall musculature appears intact.

Pelvis: Appendix is normal. No free or loculated pelvic fluid
collections. Prostate gland is not enlarged. No pelvic mass or
lymphadenopathy. No destructive bone lesions.
IMPRESSION: 3 mm stone in the distal right ureter with mild proximal
obstruction. Multiple additional punctate size nonobstructing stones
in both kidneys.
